# Patient Record
Sex: Male | Born: 1985 | Race: White | Hispanic: No | Marital: Single | Smoking: Never smoker
Health system: Southern US, Community
[De-identification: ages and names within clinical notes are randomized; demographics above are authoritative.]

---

## 2007-03-08 ENCOUNTER — Emergency Department (HOSPITAL_COMMUNITY): Admission: EM | Admit: 2007-03-08 | Discharge: 2007-03-08 | Payer: Self-pay | Admitting: *Deleted
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
## 2012-09-14 ENCOUNTER — Encounter (HOSPITAL_COMMUNITY): Payer: Self-pay | Admitting: Family Medicine

## 2012-09-14 ENCOUNTER — Emergency Department (HOSPITAL_COMMUNITY): Payer: Non-veteran care

## 2012-09-14 ENCOUNTER — Emergency Department (HOSPITAL_COMMUNITY)
Admission: EM | Admit: 2012-09-14 | Discharge: 2012-09-14 | Disposition: A | Discharge status: Home / Self Care | Payer: Non-veteran care | Attending: Emergency Medicine | Admitting: Emergency Medicine

## 2012-09-14 DIAGNOSIS — R42 Dizziness and giddiness: Secondary | ICD-10-CM | POA: Insufficient documentation

## 2012-09-14 DIAGNOSIS — R55 Syncope and collapse: Secondary | ICD-10-CM | POA: Insufficient documentation

## 2012-09-14 DIAGNOSIS — R0602 Shortness of breath: Secondary | ICD-10-CM | POA: Insufficient documentation

## 2012-09-14 DIAGNOSIS — F17200 Nicotine dependence, unspecified, uncomplicated: Secondary | ICD-10-CM | POA: Insufficient documentation

## 2012-09-14 LAB — COMPREHENSIVE METABOLIC PANEL
ALT: 21 U/L (ref 0–53)
AST: 22 U/L (ref 0–37)
Calcium: 8.9 mg/dL (ref 8.4–10.5)
Sodium: 139 mEq/L (ref 135–145)
Total Protein: 6.1 g/dL (ref 6.0–8.3)

## 2012-09-14 LAB — CBC WITH DIFFERENTIAL/PLATELET
Basophils Absolute: 0 10*3/uL (ref 0.0–0.1)
Eosinophils Absolute: 0.1 10*3/uL (ref 0.0–0.7)
Eosinophils Relative: 1 % (ref 0–5)
MCH: 30.7 pg (ref 26.0–34.0)
MCV: 83.8 fL (ref 78.0–100.0)
Platelets: 248 10*3/uL (ref 150–400)
RDW: 12.6 % (ref 11.5–15.5)
WBC: 14.3 10*3/uL — ABNORMAL HIGH (ref 4.0–10.5)

## 2012-09-14 NOTE — ED Notes (Signed)
Patient transported to X-ray 

## 2012-09-14 NOTE — ED Notes (Signed)
Patient is alert and orientedx4.  Patient was explained discharge instructions and he understood them with no questions. 

## 2012-09-14 NOTE — ED Notes (Signed)
Per pt sts he became extremely exhausted earlier, had some numbness throughout body like a cold sensation. sts lasted about 30 min with cold sweat. sts became pale. sts has happened 3 or 4 times within the last few months.

## 2012-09-14 NOTE — ED Notes (Signed)
Pt ambulated back to room from triage.

## 2012-09-14 NOTE — ED Provider Notes (Addendum)
History     CSN: [id number]  Arrival date & time 09/14/12  1826   First MD Initiated Contact with Patient 09/14/12 2124      Chief Complaint  Patient presents with  . Fatigue    (Consider location/radiation/quality/duration/timing/severity/associated sxs/prior treatment) HPI Comments: Patient states while he was exercising today he had an episode of near-syncope. He started feeling short of breath, lightheaded, tingling, hot and sweaty. He states this has happened 3 other times this month only during strenuous exercise. He denies any chest pain but had mild shortness of breath. No abdominal pain or vomiting. Now he feels normal  The history is provided by the patient.    History reviewed. No pertinent past medical history.  History reviewed. No pertinent past surgical history.  History reviewed. No pertinent family history.  History  Substance Use Topics  . Smoking status: Current Every Day Smoker  . Smokeless tobacco: Not on file  . Alcohol Use: No      Review of Systems  Respiratory: Positive for shortness of breath. Negative for cough and wheezing.   Cardiovascular: Negative for chest pain, palpitations and leg swelling.  Gastrointestinal: Negative for nausea and vomiting.  Neurological: Positive for dizziness and light-headedness. Negative for weakness.  All other systems reviewed and are negative.    Allergies  Review of patient's allergies indicates no known allergies.  Home Medications  No current outpatient prescriptions on file.  BP 157/90  Pulse 74  Temp 98.4 F (36.9 C) (Oral)  Resp 16  SpO2 97%  Physical Exam  Nursing note and vitals reviewed. Constitutional: He is oriented to person, place, and time. He appears well-developed and well-nourished. No distress.  HENT:  Head: Normocephalic and atraumatic.  Mouth/Throat: Oropharynx is clear and moist.  Eyes: Conjunctivae normal and EOM are normal. Pupils are equal, round, and reactive to light.    Neck: Normal range of motion. Neck supple.  Cardiovascular: Normal rate, regular rhythm and intact distal pulses.   No murmur heard.      No murmurs with squatting or standing up  Pulmonary/Chest: Effort normal and breath sounds normal. No respiratory distress. He has no wheezes. He has no rales.  Abdominal: Soft. He exhibits no distension. There is no tenderness. There is no rebound and no guarding.  Musculoskeletal: Normal range of motion. He exhibits no edema and no tenderness.  Neurological: He is alert and oriented to person, place, and time.  Skin: Skin is warm and dry. No rash noted. No erythema.  Psychiatric: He has a normal mood and affect. His behavior is normal.    ED Course  Procedures (including critical care time)  Labs Reviewed  CBC WITH DIFFERENTIAL - Abnormal; Notable for the following:    WBC 14.3 (*)     MCHC 36.6 (*)     Neutrophils Relative 82 (*)     Neutro Abs 11.7 (*)     All other components within normal limits  COMPREHENSIVE METABOLIC PANEL - Abnormal; Notable for the following:    Glucose, Bld 137 (*)     GFR calc non Af Amer 87 (*)     All other components within normal limits   Dg Chest 2 View  09/14/2012  *RADIOLOGY REPORT*  Clinical Data: Near-syncope on exertion.  CHEST - 2 VIEW  Comparison: None.  Findings: The lungs are well-aerated and clear.  There is no evidence of focal opacification, pleural effusion or pneumothorax.  The heart is normal in size; the mediastinal contour is within  normal limits.  No acute osseous abnormalities are seen.  IMPRESSION: No acute cardiopulmonary process seen.   Original Report Authenticated By: Tonia Ghent, M.D.     Date: 09/14/2012  Rate: 77  Rhythm: normal sinus rhythm  QRS Axis: normal  Intervals: normal  ST/T Wave abnormalities: normal  Conduction Disutrbances:none  Narrative Interpretation:   Old EKG Reviewed: none available    1. Near syncope       MDM  Patient with an episode of near syncope  today while he was working out. This is the fourth time this occurred this month during physical activity. He denies any chest pain but says he does have some mild shortness of breath when this occurs. This only happens with strenuous physical activity. He states that he was seen at the [place] once years ago when this happened and had a chest x-ray and EKG done which were normal but had no further evaluation. He has no family history of sudden cardiac death in young individuals. He states he uses marijuana but no cocaine or amphetamines. He denies smoking cigarettes. He is asymptomatic at this time. EKG shows no signs of WPW, prolonged QT or other concerning dysrhythmias. CBC with mild leukocytosis that is irrelevant with normal hemoglobin and normal CMP. Chest x-ray without signs of cardiomegaly.  Will have patient followup with outpatient cardiology and he was recommended to not do any strenuous activity.        Gwyneth Sprout, MD 09/14/12 2250  Gwyneth Sprout, MD 09/14/12 2253

## 2012-09-14 NOTE — ED Notes (Signed)
Patient denies fever, cough or pain.  Patient says he has been getting tired and overexerted when he does something physical.  Patient says he start sweating profusely, and he starts feeling weak like he is about to pass out.  He says he feels weak in his legs and needs to sit because he cant even stand. Patient says he stays well hydrated.  I asked if he is a diabetic he said he is not but both his parents are.  His blood sugar earlier in triage was 137.

## 2012-10-04 ENCOUNTER — Emergency Department (HOSPITAL_COMMUNITY)
Admission: EM | Admit: 2012-10-04 | Discharge: 2012-10-04 | Disposition: A | Discharge status: Home / Self Care | Payer: Non-veteran care | Attending: Emergency Medicine | Admitting: Emergency Medicine

## 2012-10-04 ENCOUNTER — Encounter (HOSPITAL_COMMUNITY): Payer: Self-pay | Admitting: Nurse Practitioner

## 2012-10-04 DIAGNOSIS — R109 Unspecified abdominal pain: Secondary | ICD-10-CM

## 2012-10-04 DIAGNOSIS — R197 Diarrhea, unspecified: Secondary | ICD-10-CM

## 2012-10-04 LAB — CBC WITH DIFFERENTIAL/PLATELET
Basophils Relative: 1 % (ref 0–1)
Eosinophils Absolute: 0.3 10*3/uL (ref 0.0–0.7)
HCT: 44.7 % (ref 39.0–52.0)
Hemoglobin: 16.1 g/dL (ref 13.0–17.0)
MCH: 30.7 pg (ref 26.0–34.0)
MCHC: 36 g/dL (ref 30.0–36.0)
Monocytes Absolute: 1.2 10*3/uL — ABNORMAL HIGH (ref 0.1–1.0)
Monocytes Relative: 14 % — ABNORMAL HIGH (ref 3–12)
Neutro Abs: 5.7 10*3/uL (ref 1.7–7.7)

## 2012-10-04 LAB — URINALYSIS, MICROSCOPIC ONLY
Ketones, ur: 15 mg/dL — AB
Leukocytes, UA: NEGATIVE
Nitrite: NEGATIVE
Protein, ur: NEGATIVE mg/dL
Urobilinogen, UA: 0.2 mg/dL (ref 0.0–1.0)
pH: 7.5 (ref 5.0–8.0)

## 2012-10-04 LAB — COMPREHENSIVE METABOLIC PANEL
Albumin: 3.6 g/dL (ref 3.5–5.2)
BUN: 8 mg/dL (ref 6–23)
Chloride: 104 mEq/L (ref 96–112)
Creatinine, Ser: 1.16 mg/dL (ref 0.50–1.35)
GFR calc Af Amer: >90 mL/min (ref >90)
Glucose, Bld: 110 mg/dL — ABNORMAL HIGH (ref 70–99)
Total Bilirubin: 0.7 mg/dL (ref 0.3–1.2)

## 2012-10-04 LAB — LIPASE, BLOOD: Lipase: 11 U/L (ref 11–59)

## 2012-10-04 MED ORDER — METRONIDAZOLE 500 MG PO TABS: 500.0000 mg | ORAL_TABLET | Freq: Three times a day (TID) | ORAL | Status: AC

## 2012-10-04 NOTE — ED Notes (Signed)
Pt c/o abd pain, constipation and diarrhea for past 2 weeks. Took otc meds with no relief. Pt denies n/v

## 2012-10-04 NOTE — ED Provider Notes (Signed)
History     CSN: [id number]  Arrival date & time 10/04/12  1042   First MD Initiated Contact with Patient 10/04/12 1219      Chief Complaint  Patient presents with  . Abdominal Pain    (Consider location/radiation/quality/duration/timing/severity/associated sxs/prior treatment) HPI Comments: Patient presents with a two week history of generalized abdominal cramping, frequent loose stool.  He describes the stool as "flaky" and occasionally greenish in color.  He has been trying otc meds but has had no relief.  No fevers or chills.  No blood in the stool.  No ill contacts.  Patient is a 27 y.o. male presenting with abdominal pain. The history is provided by the patient.  Abdominal Pain Pain location:  Generalized Pain quality: cramping   Pain radiates to:  Does not radiate Pain severity:  Moderate Onset quality:  Gradual Timing:  Intermittent Progression:  Waxing and waning Chronicity:  New Relieved by:  Nothing Worsened by:  Eating   History reviewed. No pertinent past medical history.  History reviewed. No pertinent past surgical history.  History reviewed. No pertinent family history.  History  Substance Use Topics  . Smoking status: Never Smoker   . Smokeless tobacco: Not on file  . Alcohol Use: Yes     Comment: social      Review of Systems  Gastrointestinal: Positive for abdominal pain.  All other systems reviewed and are negative.    Allergies  Review of patient's allergies indicates no known allergies.  Home Medications   Current Outpatient Rx  Name  Route  Sig  Dispense  Refill  . bismuth subsalicylate (PEPTO BISMOL) 262 MG/15ML suspension   Oral   Take 15 mLs by mouth every 6 (six) hours as needed for indigestion. For indigestion         . loperamide (IMODIUM) 2 MG capsule   Oral   Take 2 mg by mouth 4 (four) times daily as needed for diarrhea or loose stools. Took took capsules then 1 capsule following first stool         . OVER THE  COUNTER MEDICATION                 BP 141/73  Pulse 52  Temp(Src) 97.5 F (36.4 C) (Oral)  Resp 18  SpO2 98%  Physical Exam  Nursing note and vitals reviewed. Constitutional: He is oriented to person, place, and time. He appears well-developed and well-nourished. No distress.  HENT:  Head: Normocephalic and atraumatic.  Neck: Normal range of motion. Neck supple.  Cardiovascular: Normal rate and regular rhythm.   No murmur heard. Pulmonary/Chest: Effort normal and breath sounds normal. No respiratory distress. He has no wheezes.  Abdominal: Soft. Bowel sounds are normal. He exhibits no distension.  There is mild diffuse ttp but no rebound or guarding.    Musculoskeletal: Normal range of motion.  Lymphadenopathy:    He has no cervical adenopathy.  Neurological: He is alert and oriented to person, place, and time.  Skin: Skin is warm and dry. He is not diaphoretic.    ED Course  Procedures (including critical care time)  Labs Reviewed  CBC WITH DIFFERENTIAL - Abnormal; Notable for the following:    Monocytes Relative 14 (*)    Monocytes Absolute 1.2 (*)    All other components within normal limits  COMPREHENSIVE METABOLIC PANEL - Abnormal; Notable for the following:    Glucose, Bld 110 (*)    GFR calc non Af Amer 87 (*)  All other components within normal limits  URINALYSIS, MICROSCOPIC ONLY - Abnormal; Notable for the following:    APPearance CLOUDY (*)    Bilirubin Urine SMALL (*)    Ketones, ur 15 (*)    Casts GRANULAR CAST (*)    All other components within normal limits  LIPASE, BLOOD   No results found.   No diagnosis found.    MDM  He does not appear clinically dehydrated.  The heart rate is 50 and the bp is stable.  The mucous membranes are moist and the electrolytes are unremarkable.  I suspect that this is infectious in etiology and will treat with flagyl.  Also considered is Crohns, but I am leaning toward infection.  He is to return in the next  2 days to consider a ct scan if he worsens or does not improve.          Geoffery Lyons, MD 10/04/12 [phone]

## 2012-10-07 ENCOUNTER — Encounter (HOSPITAL_COMMUNITY): Payer: Self-pay | Admitting: *Deleted

## 2012-10-07 ENCOUNTER — Emergency Department (HOSPITAL_COMMUNITY)
Admission: EM | Admit: 2012-10-07 | Discharge: 2012-10-07 | Disposition: A | Discharge status: Home / Self Care | Payer: Non-veteran care | Attending: Emergency Medicine | Admitting: Emergency Medicine

## 2012-10-07 DIAGNOSIS — R109 Unspecified abdominal pain: Secondary | ICD-10-CM | POA: Insufficient documentation

## 2012-10-07 DIAGNOSIS — R1110 Vomiting, unspecified: Secondary | ICD-10-CM | POA: Insufficient documentation

## 2012-10-07 DIAGNOSIS — R197 Diarrhea, unspecified: Secondary | ICD-10-CM | POA: Insufficient documentation

## 2012-10-07 LAB — CBC WITH DIFFERENTIAL/PLATELET
Basophils Absolute: 0.1 10*3/uL (ref 0.0–0.1)
Eosinophils Absolute: 0.3 10*3/uL (ref 0.0–0.7)
Lymphocytes Relative: 14 % (ref 12–46)
Lymphs Abs: 1.1 10*3/uL (ref 0.7–4.0)
Neutrophils Relative %: 61 % (ref 43–77)
Platelets: 227 10*3/uL (ref 150–400)
RBC: 5.36 MIL/uL (ref 4.22–5.81)
RDW: 12.7 % (ref 11.5–15.5)
WBC: 8 10*3/uL (ref 4.0–10.5)

## 2012-10-07 LAB — COMPREHENSIVE METABOLIC PANEL
Alkaline Phosphatase: 63 U/L (ref 39–117)
BUN: 9 mg/dL (ref 6–23)
CO2: 27 mEq/L (ref 19–32)
Chloride: 98 mEq/L (ref 96–112)
Creatinine, Ser: 1.26 mg/dL (ref 0.50–1.35)
GFR calc non Af Amer: 79 mL/min — ABNORMAL LOW (ref >90)
Glucose, Bld: 104 mg/dL — ABNORMAL HIGH (ref 70–99)
Potassium: 4.3 mEq/L (ref 3.5–5.1)
Total Bilirubin: 0.5 mg/dL (ref 0.3–1.2)

## 2012-10-07 MED ORDER — CIPROFLOXACIN HCL 500 MG PO TABS: 500.0000 mg | ORAL_TABLET | Freq: Two times a day (BID) | ORAL | Status: AC

## 2012-10-07 MED ORDER — ONDANSETRON HCL 8 MG PO TABS: 8.0000 mg | ORAL_TABLET | ORAL | Status: AC | PRN

## 2012-10-07 MED ORDER — ONDANSETRON 4 MG PO TBDP
8.0000 mg | ORAL_TABLET | Freq: Once | ORAL | Status: AC
  Administered 2012-10-07: 8 mg via ORAL
  Filled 2012-10-07: qty 2

## 2012-10-07 NOTE — ED Provider Notes (Signed)
History     CSN: [id number]  Arrival date & time 10/07/12  1119   First MD Initiated Contact with Patient 10/07/12 1140      Chief Complaint  Patient presents with  . Dehydration  . Emesis     Patient is a 27 y.o. male presenting with diarrhea. The history is provided by the patient.  Diarrhea Quality:  Watery and mucous Onset quality:  Gradual Duration: over a week ago. Timing:  Intermittent Progression:  Unchanged Relieved by:  Nothing Worsened by:  Nothing tried Associated symptoms: abdominal pain and vomiting   Associated symptoms: no fever    Pt presents for diarrhea.  He has had this for over a week.  He reports today he had one episode of vomiting after taking medications.  He also reports mild abdominal cramping Denies bloody or dark stools.  He has not vomited blood Denies recent travel.  No sick contacts He reports he feels fatigue  PMH - none  History reviewed. No pertinent past surgical history.  History reviewed. No pertinent family history.  History  Substance Use Topics  . Smoking status: Never Smoker   . Smokeless tobacco: Not on file  . Alcohol Use: Yes     Comment: social      Review of Systems  Constitutional: Negative for fever.  Cardiovascular: Negative for chest pain.  Gastrointestinal: Positive for vomiting, abdominal pain and diarrhea. Negative for blood in stool.  Genitourinary: Negative for dysuria, flank pain and testicular pain.  Psychiatric/Behavioral: Negative for agitation.  All other systems reviewed and are negative.    Allergies  Review of patient's allergies indicates no known allergies.  Home Medications   Current Outpatient Rx  Name  Route  Sig  Dispense  Refill  . bismuth subsalicylate (PEPTO BISMOL) 262 MG/15ML suspension   Oral   Take 15 mLs by mouth every 6 (six) hours as needed for indigestion. For indigestion         . loperamide (IMODIUM) 2 MG capsule   Oral   Take 2 mg by mouth 4 (four) times daily  as needed for diarrhea or loose stools. Took took capsules then 1 capsule following first stool         . metroNIDAZOLE (FLAGYL) 500 MG tablet   Oral   Take 1 tablet (500 mg total) by mouth 3 (three) times daily. One po bid x 7 days   21 tablet   0   . OVER THE COUNTER MEDICATION               . ciprofloxacin (CIPRO) 500 MG tablet   Oral   Take 1 tablet (500 mg total) by mouth every 12 (twelve) hours.   14 tablet   0   . ondansetron (ZOFRAN) 8 MG tablet   Oral   Take 1 tablet (8 mg total) by mouth every 4 (four) hours as needed for nausea.   4 tablet   0     BP 129/69  Pulse 92  Temp(Src) 97.5 F (36.4 C) (Oral)  Resp 16  SpO2 97%  Physical Exam CONSTITUTIONAL: Well developed/well nourished HEAD AND FACE: Normocephalic/atraumatic EYES: EOMI/PERRL, no icterus noted ENMT: Mucous membranes moist NECK: supple no meningeal signs SPINE:entire spine nontender CV: S1/S2 noted, no murmurs/rubs/gallops noted LUNGS: Lungs are clear to auscultation bilaterally, no apparent distress ABDOMEN: soft, nontender, no rebound or guarding GU:no cva tenderness NEURO: Pt is awake/alert, moves all extremitiesx4, ambulatory EXTREMITIES: pulses normal, full ROM SKIN: warm, color normal PSYCH:  no abnormalities of mood noted  ED Course  Procedures  Labs Reviewed  COMPREHENSIVE METABOLIC PANEL - Abnormal; Notable for the following:    Glucose, Bld 104 (*)    GFR calc non Af Amer 79 (*)    All other components within normal limits  CBC WITH DIFFERENTIAL - Abnormal; Notable for the following:    MCHC 36.5 (*)    Monocytes Relative 20 (*)    Monocytes Absolute 1.6 (*)    All other components within normal limits   No results found.   1. Abdominal pain   2. Diarrhea    Pt very well appearing and hsi labs are reassuring.  Predominant feature is abd. Cramping with diarrhea and mucous in stool.  He denies rectal bleeding.  He vomited once today but was associated with meds.  His  abdominal exam is benign.  I doubt acute abd process.  I did add on cipro to his flagyll in case this is infectious.  He was unable to produce stool sample here.  I referred him to GI.     MDM  Nursing notes including past medical history and social history reviewed and considered in documentation Labs/vital reviewed and considered         Joya Gaskins, MD 10/07/12 1635

## 2012-10-07 NOTE — ED Notes (Signed)
Pt. Continues to go to the bathroom, but is not having any diarheea, just the feeling.

## 2012-10-07 NOTE — ED Notes (Signed)
Pt. oob to the bathroom X2, gait steady.

## 2012-10-07 NOTE — ED Notes (Signed)
Pt was seen here on 2/9 and treated for GI infection. Reports still having n/v and not drinking fluids, unsure if antibiotics are working. No acute distress noted at triage.

## 2020-05-07 ENCOUNTER — Other Ambulatory Visit: Payer: Self-pay

## 2020-05-07 ENCOUNTER — Emergency Department (HOSPITAL_COMMUNITY): Payer: No Typology Code available for payment source

## 2020-05-07 ENCOUNTER — Encounter (HOSPITAL_COMMUNITY): Payer: Self-pay | Admitting: Emergency Medicine

## 2020-05-07 ENCOUNTER — Emergency Department (HOSPITAL_COMMUNITY)
Admission: EM | Admit: 2020-05-07 | Discharge: 2020-05-07 | Disposition: A | Discharge status: Home / Self Care | Payer: No Typology Code available for payment source | Attending: Emergency Medicine | Admitting: Emergency Medicine

## 2020-05-07 DIAGNOSIS — R55 Syncope and collapse: Secondary | ICD-10-CM | POA: Insufficient documentation

## 2020-05-07 DIAGNOSIS — R0789 Other chest pain: Secondary | ICD-10-CM | POA: Insufficient documentation

## 2020-05-07 DIAGNOSIS — Z5321 Procedure and treatment not carried out due to patient leaving prior to being seen by health care provider: Secondary | ICD-10-CM | POA: Insufficient documentation

## 2020-05-07 DIAGNOSIS — R42 Dizziness and giddiness: Secondary | ICD-10-CM | POA: Diagnosis not present

## 2020-05-07 LAB — CBC
HCT: 44.2 % (ref 39.0–52.0)
Hemoglobin: 15 g/dL (ref 13.0–17.0)
MCH: 30 pg (ref 26.0–34.0)
MCHC: 33.9 g/dL (ref 30.0–36.0)
MCV: 88.4 fL (ref 80.0–100.0)
Platelets: 221 10*3/uL (ref 150–400)
RBC: 5 MIL/uL (ref 4.22–5.81)
RDW: 12.1 % (ref 11.5–15.5)
WBC: 7.9 10*3/uL (ref 4.0–10.5)
nRBC: 0 % (ref 0.0–0.2)

## 2020-05-07 LAB — BASIC METABOLIC PANEL
Anion gap: 8 (ref 5–15)
BUN: 11 mg/dL (ref 6–20)
CO2: 28 mmol/L (ref 22–32)
Calcium: 9.2 mg/dL (ref 8.9–10.3)
Chloride: 105 mmol/L (ref 98–111)
Creatinine, Ser: 1.16 mg/dL (ref 0.61–1.24)
GFR calc Af Amer: >60 mL/min (ref >60)
GFR calc non Af Amer: >60 mL/min (ref >60)
Glucose, Bld: 105 mg/dL — ABNORMAL HIGH (ref 70–99)
Potassium: 3.9 mmol/L (ref 3.5–5.1)
Sodium: 141 mmol/L (ref 135–145)

## 2020-05-07 LAB — TROPONIN I (HIGH SENSITIVITY): Troponin I (High Sensitivity): <2 ng/L (ref <18)

## 2020-05-07 NOTE — ED Triage Notes (Signed)
Pt reports syncopal episode yesterday.  Today has chest tightness and dizziness.

## 2020-05-07 NOTE — ED Notes (Signed)
Pt called x3 for rollcall without response.  

## 2021-08-17 IMAGING — CR DG CHEST 2V
2 series · 2 of 2 positions shown · non-contrast
Comparison: June 26, 2019

CLINICAL DATA: Chest pain and syncope

EXAM:
CHEST - 2 VIEW

[chest lat]
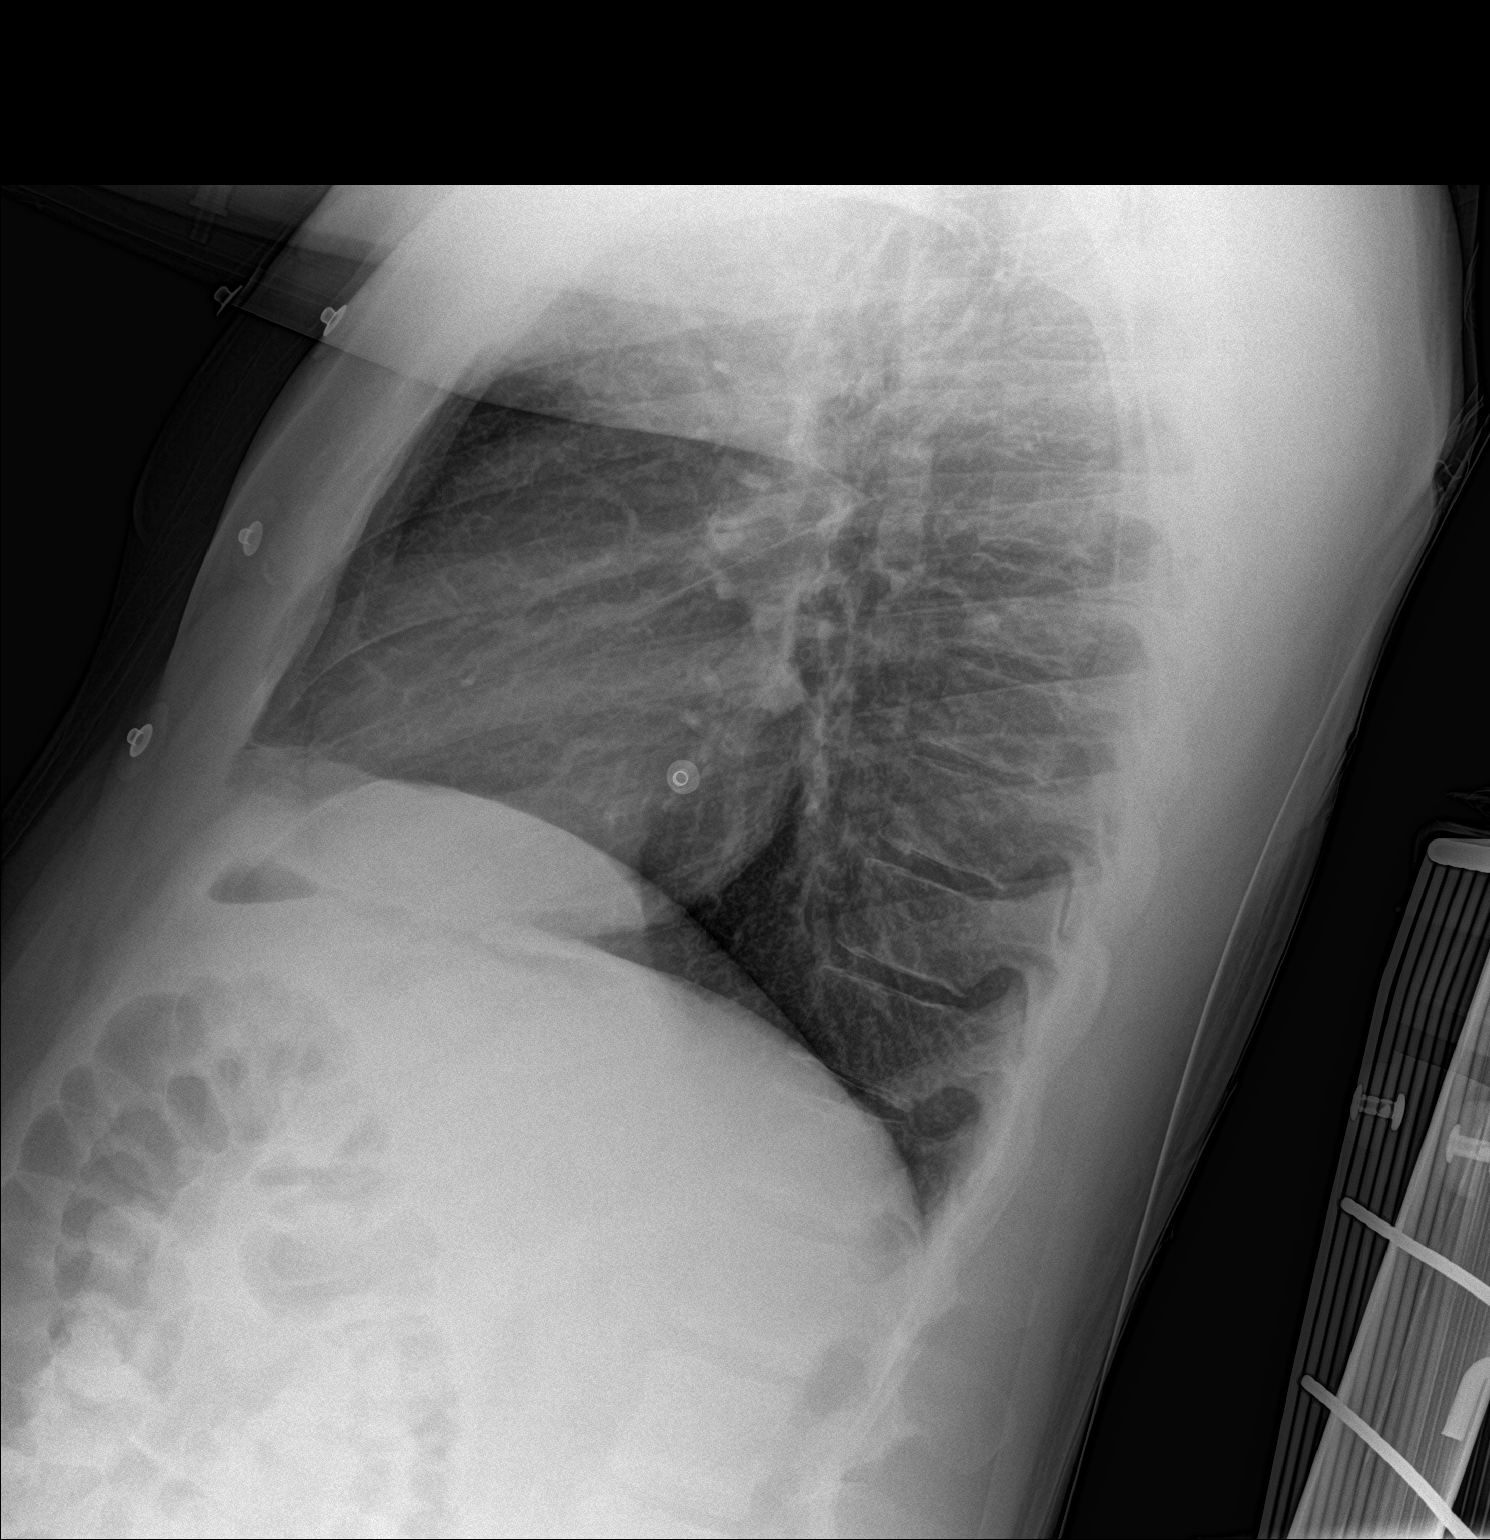

[chest ap]
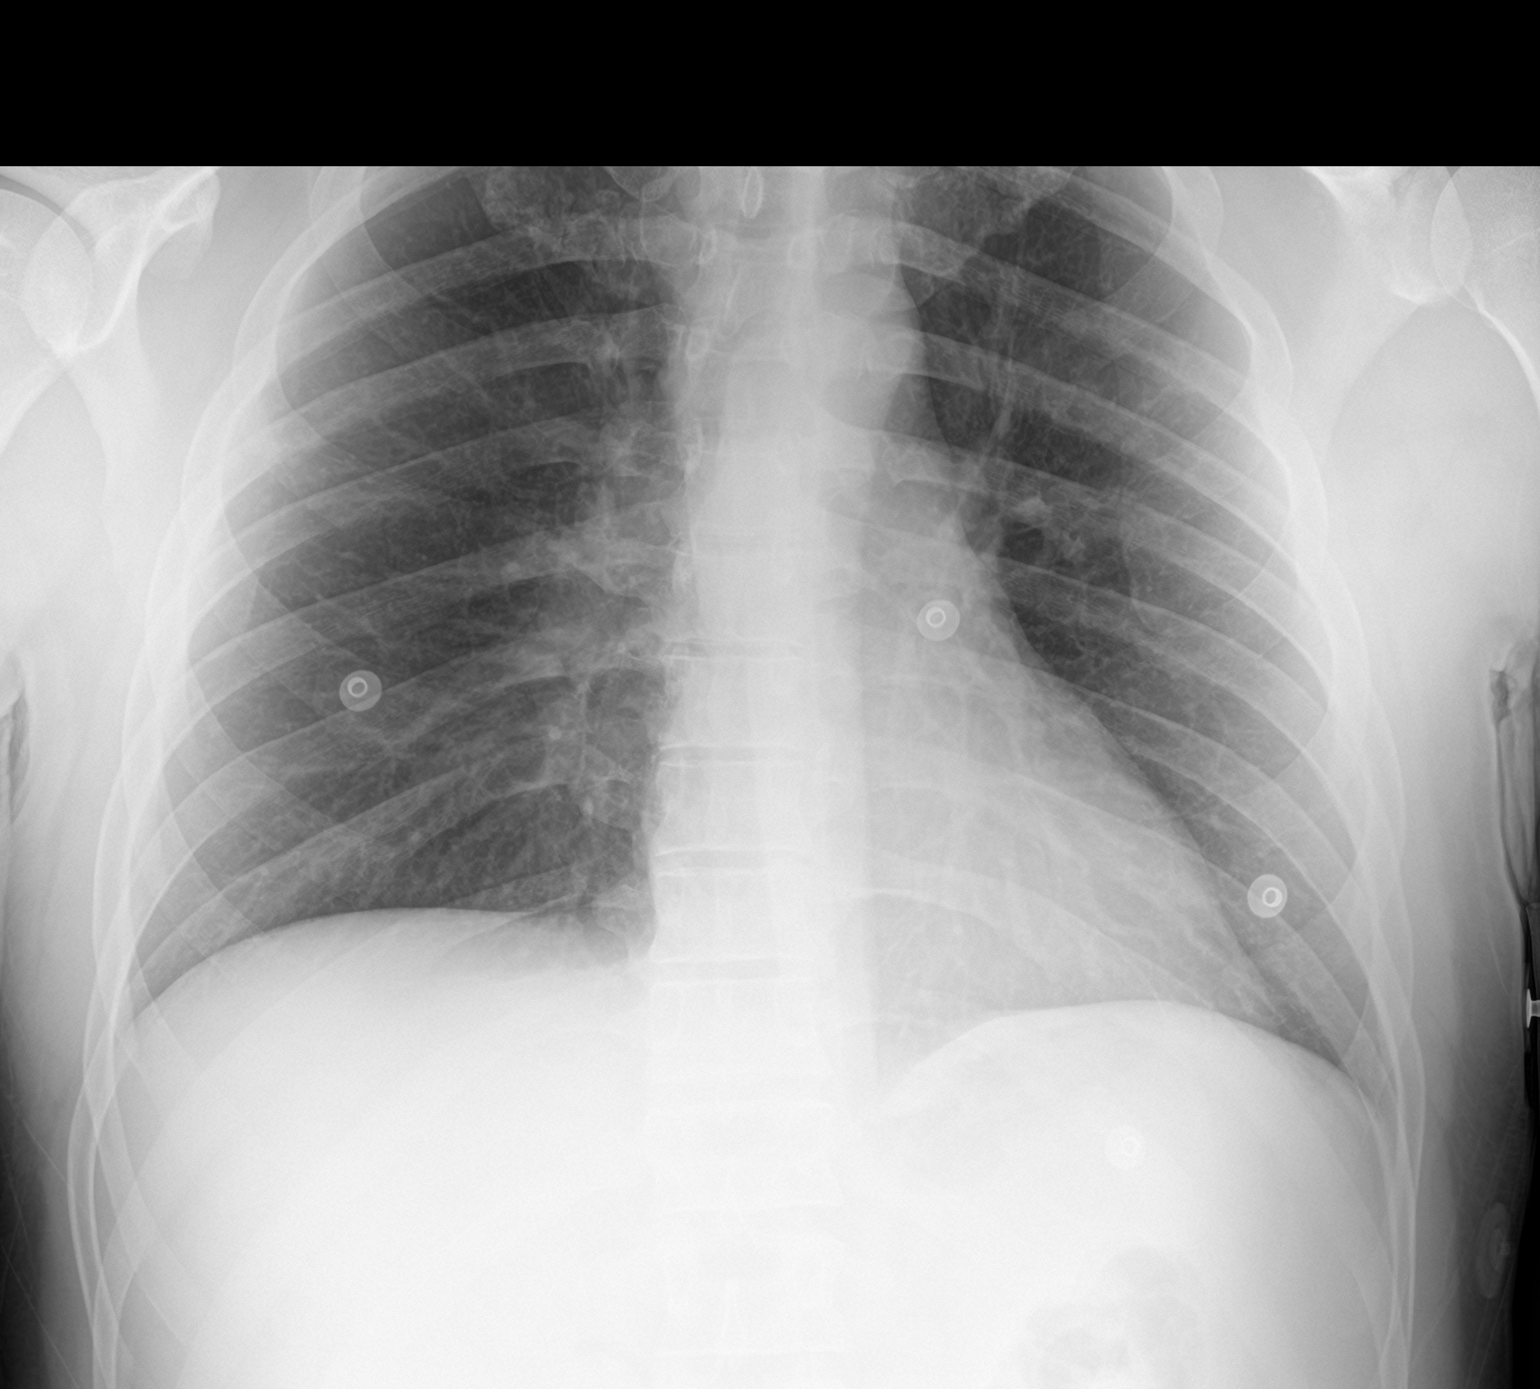

[2 of 2 positions shown; findings below may reference images not displayed]

FINDINGS: Lungs are clear. Heart size and pulmonary vascularity are normal. No
adenopathy. No pneumothorax. No bone lesions.
IMPRESSION: Lungs clear.  Cardiac silhouette normal.
